# Patient Record
Sex: Male | Born: 2005 | Race: Black or African American | Hispanic: No | Marital: Single
Health system: Southern US, Community
[De-identification: ages and names within clinical notes are randomized; demographics above are authoritative.]

## PROBLEM LIST (undated history)

## (undated) HISTORY — PX: CIRCUMCISION: SUR203

---
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
## 2024-04-29 ENCOUNTER — Telehealth: Payer: Self-pay | Admitting: Pediatrics

## 2024-04-29 NOTE — Telephone Encounter (Signed)
 Called main number on file to schedule wcc na lvm
